# Patient Record
Sex: Female | Born: 1990 | Race: Black or African American | Hispanic: No | Marital: Single | Smoking: Current every day smoker
Health system: Southern US, Community
[De-identification: ages and names within clinical notes are randomized; demographics above are authoritative.]

---

## 2014-12-21 ENCOUNTER — Emergency Department (HOSPITAL_COMMUNITY)
Admission: EM | Admit: 2014-12-21 | Discharge: 2014-12-22 | Disposition: A | Discharge status: Home / Self Care | Payer: BLUE CROSS/BLUE SHIELD | Attending: Emergency Medicine | Admitting: Emergency Medicine

## 2014-12-21 ENCOUNTER — Encounter (HOSPITAL_COMMUNITY): Payer: Self-pay | Admitting: Emergency Medicine

## 2014-12-21 DIAGNOSIS — Z720 Tobacco use: Secondary | ICD-10-CM | POA: Insufficient documentation

## 2014-12-21 DIAGNOSIS — L509 Urticaria, unspecified: Secondary | ICD-10-CM | POA: Diagnosis not present

## 2014-12-21 DIAGNOSIS — R21 Rash and other nonspecific skin eruption: Secondary | ICD-10-CM | POA: Diagnosis present

## 2014-12-21 MED ORDER — DIPHENHYDRAMINE HCL 25 MG PO CAPS
50.0000 mg | ORAL_CAPSULE | Freq: Once | ORAL | Status: AC
  Administered 2014-12-21: 50 mg via ORAL
  Filled 2014-12-21: qty 2

## 2014-12-21 NOTE — ED Provider Notes (Signed)
CSN: [id number]     Arrival date & time 12/21/14  2207 History   First MD Initiated Contact with Patient 12/21/14 2232     Chief Complaint  Patient presents with  . Rash     (Consider location/radiation/quality/duration/timing/severity/associated sxs/prior Treatment) HPI Comments: Patient presents to the emergency department with chief complaint of hives on upper and lower extremities and torso today. Patient states symptoms started around 2 PM. She has tried using hydrocortisone cream as well as showering with no real relief. She denies any fevers, chills, nausea, vomiting, throat tightening sensation, or difficulty breathing. There are no aggravating factors. She has never had anything like this before. She denies any new contacts to allergens.  The history is provided by the patient. No language interpreter was used.    History reviewed. No pertinent past medical history. Past Surgical History  Procedure Laterality Date  . Cesarean section     No family history on file. Social History  Substance Use Topics  . Smoking status: Current Every Day Smoker  . Smokeless tobacco: None  . Alcohol Use: Yes   OB History    No data available     Review of Systems  Constitutional: Negative for fever and chills.  Respiratory: Negative for shortness of breath.   Cardiovascular: Negative for chest pain.  Gastrointestinal: Negative for nausea, vomiting, diarrhea and constipation.  Genitourinary: Negative for dysuria.  Skin: Positive for rash.      Allergies  Review of patient's allergies indicates no known allergies.  Home Medications   Prior to Admission medications   Not on File   BP 120/87 mmHg  Pulse 96  Temp(Src) 98.7 F (37.1 C) (Oral)  Resp 14  Ht  (1.626 m)  Wt 128 lb (58.06 kg)  BMI 21.96 kg/m2  SpO2 98%  LMP 12/07/2014 Physical Exam  Constitutional: She is oriented to person, place, and time. She appears well-developed and well-nourished.  HENT:  Head:  Normocephalic and atraumatic.  Oropharynx is clear, no stridor, no airway compromise  Eyes: Conjunctivae and EOM are normal. Pupils are equal, round, and reactive to light.  Neck: Normal range of motion. Neck supple.  Cardiovascular: Normal rate and regular rhythm.  Exam reveals no gallop and no friction rub.   No murmur heard. Pulmonary/Chest: Effort normal and breath sounds normal. No respiratory distress. She has no wheezes. She has no rales. She exhibits no tenderness.  Clear to auscultation bilaterally  Abdominal: Soft. Bowel sounds are normal. She exhibits no distension and no mass. There is no tenderness. There is no rebound and no guarding.  Musculoskeletal: Normal range of motion. She exhibits no edema or tenderness.  Neurological: She is alert and oriented to person, place, and time.  Skin: Skin is warm and dry.  Hives on extremities and torso  Psychiatric: She has a normal mood and affect. Her behavior is normal. Judgment and thought content normal.  Nursing note and vitals reviewed.   ED Course  Procedures (including critical care time)   MDM   Final diagnoses:  Hives    Patient with hives, will treat with Benadryl, no evidence of anaphylactic reaction. Anticipate discharge to home with Benadryl and OTC antihistamine.   12:24 AM Patient improved after benadryl.  Feels well.  DC to home.  Continue benadryl and add zyrtec.   Roxy Horseman, PA-C 12/22/14 4098  Devoria Albe, MD 12/22/14 [id number]

## 2014-12-21 NOTE — ED Notes (Signed)
Pt. reports itchy skin  rashes/hives at arms/legs and torso onset today , denies fever , airway intact / respirations unlabored .

## 2014-12-22 NOTE — ED Notes (Signed)
Pt left with all belongings and ambulated out of the treatment area.  

## 2014-12-22 NOTE — Discharge Instructions (Signed)
Please take benadryl and zyrtec for the next 3-4 days.  If your symptoms persist, please follow-up with your regular doctor or see one listed below.  Hives Hives are itchy, red, swollen areas of the skin. They can vary in size and location on your body. Hives can come and go for hours or several days (acute hives) or for several weeks (chronic hives). Hives do not spread from person to person (noncontagious). They may get worse with scratching, exercise, and emotional stress. CAUSES   Allergic reaction to food, additives, or drugs.  Infections, including the common cold.  Illness, such as vasculitis, lupus, or thyroid disease.  Exposure to sunlight, heat, or cold.  Exercise.  Stress.  Contact with chemicals. SYMPTOMS   Red or white swollen patches on the skin. The patches may change size, shape, and location quickly and repeatedly.  Itching.  Swelling of the hands, feet, and face. This may occur if hives develop deeper in the skin. DIAGNOSIS  Your caregiver can usually tell what is wrong by performing a physical exam. Skin or blood tests may also be done to determine the cause of your hives. In some cases, the cause cannot be determined. TREATMENT  Mild cases usually get better with medicines such as antihistamines. Severe cases may require an emergency epinephrine injection. If the cause of your hives is known, treatment includes avoiding that trigger.  HOME CARE INSTRUCTIONS   Avoid causes that trigger your hives.  Take antihistamines as directed by your caregiver to reduce the severity of your hives. Non-sedating or low-sedating antihistamines are usually recommended. Do not drive while taking an antihistamine.  Take any other medicines prescribed for itching as directed by your caregiver.  Wear loose-fitting clothing.  Keep all follow-up appointments as directed by your caregiver. SEEK MEDICAL CARE IF:   You have persistent or severe itching that is not relieved with  medicine.  You have painful or swollen joints. SEEK IMMEDIATE MEDICAL CARE IF:   You have a fever.  Your tongue or lips are swollen.  You have trouble breathing or swallowing.  You feel tightness in the throat or chest.  You have abdominal pain. These problems may be the first sign of a life-threatening allergic reaction. Call your local emergency services (911 in U.S.). MAKE SURE YOU:   Understand these instructions.  Will watch your condition.  Will get help right away if you are not doing well or get worse. Document Released: 04/12/2005 Document Revised: 04/17/2013 Document Reviewed: 07/06/2011 [facility] Patient Information 2015 [employer], [place]. This information is not intended to replace advice given to you by your health care provider. Make sure you discuss any questions you have with your health care provider.   Emergency Department Resource Guide 1) Find a Doctor and Pay Out of Pocket Although you won't have to find out who is covered by your insurance plan, it is a good idea to ask around and get recommendations. You will then need to call the office and see if the doctor you have chosen will accept you as a new patient and what types of options they offer for patients who are self-pay. Some doctors offer discounts or will set up payment plans for their patients who do not have insurance, but you will need to ask so you aren't surprised when you get to your appointment.  2) Contact Your Local Health Department Not all health departments have doctors that can see patients for sick visits, but many do, so it is worth a call  to see if yours does. If you don't know where your local health department is, you can check in your phone book. The CDC also has a tool to help you locate your state's health department, and many state websites also have listings of all of their local health departments.  3) Find a Walk-in Clinic If your illness is not likely to be very severe or  complicated, you may want to try a walk in clinic. These are popping up all over the country in pharmacies, drugstores, and shopping centers. They're usually staffed by nurse practitioners or physician assistants that have been trained to treat common illnesses and complaints. They're usually fairly quick and inexpensive. However, if you have serious medical issues or chronic medical problems, these are probably not your best option.  No Primary Care Doctor: - Call Health Connect at  [phone] - they can help you locate a primary care doctor that  accepts your insurance, provides certain services, etc. - Physician Referral Service- [phone]  Chronic Pain Problems: Organization         Address  Phone   Notes  [facility]  [phone] Patients need to be referred by their primary care doctor.   Medication Assistance: Organization         Address  Phone   Notes  [facility] Medication [facility] [address] [place] [place], [place] [address] [phone] --Must be a resident of [facility] -- Must have NO insurance coverage whatsoever (no Medicaid/ Medicare, etc.) -- The pt. MUST have a primary care doctor that directs their care regularly and follows them in the community   [facility]  [phone]   [employer]  [phone]    Agencies that provide inexpensive medical care: Organization         Address  Phone   Notes  [facility]  [phone]   [facility]    [phone]   [facility] [address] [place], [place] [address] [phone]   [facility] [address], [place] [phone]   Planned Parenthood    [phone]   [facility]    [phone]   [facility]  [address], [place] Phone:  [phone], Fax:  [phone] Hours of Operation:  9 am - 6 pm, M-F.  Also accepts  Medicaid/Medicare and self-pay.  [facility]  [address], [place] Phone: [phone], Fax: [phone]. Hours of Operation:  8:30 am - 5:30 pm, M-F.  Also accepts Medicaid and self-pay.  [facility] [place] [address], [place] Phone: [phone]   [facility] [address] Natasha Bence [place], [place] [phone], Ext. [phone] Mondays & Thursdays: 7-9 AM.  First 15 patients are seen on a first come, first serve basis.    Medicaid-accepting [facility] Providers:  Organization         Address  Phone   Notes  [facility] [address], [place] [phone] Also accepts self-pay patients.  [facility] [address] Laurell Josephs [place], [place]  [phone]   [facility] [address], [place] [phone]   [facility] [address], [place] [phone]   Renaye Rakers [address], [place]   (  [phone] Only accepts [place] Access Medicaid patients after they have their name applied to their card.   Self-Pay (no insurance) in [facility] [place]:  Organization         Address  Phone   Notes  Sickle Cell Patients, [facility] [address], [place] [phone]   [facility] [address], [place] [phone]   [facility] [place]  [address], [place] [phone]   [facility]/Dr. Osei-Bonsu  [address] [place] [address], [place] or [phone], [place] [phone] Phone number for both [place] and [place] locations is the same.  [facility] and [facility] [address], [place] [phone]   [facility] [address] [place] [address], [place] or [address] [phone]   [facility] [address], [place] [phone])  [phone], phone; [phone]) [phone], fax Sees patients 1st and 3rd Saturday of every month.  Must not qualify for public or private insurance (i.e. Medicaid, Medicare, [place] Health Choice, Veterans' Benefits)  Household income should be no more than 200% of the poverty level The clinic cannot treat you if you are pregnant or think you are pregnant  Sexually transmitted diseases are not treated at the clinic.    Dental Care: Organization         Address  Phone  Notes  [facility] of [facility] [address], [place] [phone] Accepts children up to age 26 who are enrolled in [place] or [place] Health Choice; pregnant women with a Medicaid card; and children who have applied for Medicaid or [place] Health Choice, but were declined, whose parents can pay a reduced fee at time of service.  [facility] of [facility] [place]  [address], [place] [phone] Accepts children up to age 26 who are enrolled in [place] or [place] Health Choice; pregnant women with a Medicaid card; and children who have applied for Medicaid or [place] Health Choice, but were declined, whose parents can pay a reduced fee at time of service.  [facility]  [address], [place] [phone] Patients are seen by appointment only. Walk-ins are not accepted. [facility] will see patients 26 years of age and older. Monday - Tuesday (8am-5pm) Most Wednesdays (8:30-5pm) $30 per visit, cash only  [facility]  [address], [facility] [phone] Patients are seen by appointment only. Walk-ins are not accepted. [facility] will see patients 26 years of age and older. One Wednesday Evening (Monthly: Volunteer Based).  $30 per visit, cash only  [employer] [facility]  [phone]) [phone] for adults; Children under age 394, call Graduate Pediatric Dentistry at [phone]) [phone]. Children aged  254-14, please call [phone]) [phone] to request a pediatric application.  Dental services are provided in all areas of dental care including fillings, crowns and bridges, complete and partial dentures, implants, gum treatment, root canals, and extractions. Preventive care is also provided. Treatment is provided to both adults and children. Patients are selected via a lottery and there is often a waiting list.   [facility] [address], [place]  [phone] [email or web address]   [facility] [address], [place], [place] [phone], Ext. [phone] Second and Fourth Thursday of each month, opens at 6:30  AM; Clinic ends at 9 AM.  Patients are seen on a first-come first-served basis, and a limited number are seen during each clinic.   [facility]  [address] Ether Griffins [place], [place] [phone]   Eligibility Requirements You must have lived in [place], [place], or [place] counties for at least the last three months.   You cannot be eligible for state or federal sponsored [place], including [employer], [place], or [employer].   You generally cannot be eligible for healthcare insurance through your employer.    How to apply: Eligibility screenings are held every Tuesday and Wednesday afternoon from 1:00 pm until 4:00 pm. You do not need an appointment for the interview!  [facility] [address] [place], [place] [phone]   [facility]  [phone]   [facility] Health Department  [phone]   [facility] Health Department  [phone]    Behavioral Health Resources in the Community: Intensive Outpatient Programs Organization         Address  Phone  Notes  [facility] [address], [place], [place] [phone]   [facility] Outpatient [address] [place], [place] [phone]   [facility] [address]  [place], [place]  [phone]   [facility] [address],  [place], [place] [phone] or [phone]   Substance Abuse Resources Organization         Address  Phone  Notes  [facility] Drug Services  [phone]   [facility]  [phone]   [facility] [place]  [phone]   Floydene Flock  [phone]   Residential & Outpatient Substance Abuse Program  [phone]   Psychological Services Organization         Address  Phone  Notes  [facility] Behavioral Health  [phone]   [facility]  [phone]   [facility] [address], [place] [phone] or [phone]    Mobile Crisis Teams Organization         Address  Phone  Notes  [facility], Mobile Crisis Care Unit  [phone]   [facility]  [address] [place], [place] [phone]   Doristine Locks [address] [place] [phone]    Self-Help/Support Groups Organization         Address  Phone             Notes  [facility] [place] - variety of support groups  336- I7437963 Call for more information  Narcotics Anonymous (NA), [facility] [address], [employer] [place]  2 meetings at this location   Statistician         Address  Phone  Notes  [facility] [address],    [place]  [phone]   [facility]  [address], [place] [address], [place], [place] [phone]   [facility] [address] [place], [place] [phone] Admissions: 8am-3pm M-F  [facility] [address]    [place], [place] [phone]   [facility] [address] Starling Manns [place], [place] [phone]   [facility] [address]  [place], [place] [phone]   [facility] - Intensive Outpatient [address] Laurell Josephs [address], [place], [place] [phone]   [facility]  ([facility]iction [facility] [address] [place], [place] [phone] or [phone]  [facility] ([facility]) [address] [place], [place] [phone] Accepts Medicaid  [facility] [address]  [place] [phone] Substance Abuse/Addiction Treatment   [facility] Organization         Address  Phone  Notes  [facility]  [phone]   Angie Fava, PhD [address] Ervin Knack [place], [place]   [phone] or ([phone])[phone]   [facility] Behavioral   [address] [place], [place] [phone]   [facility] [address] [place], [place] [phone] Insurance/Medicaid/sponsorship through [facility] and Families [address]                                    [place], [place] [phone] Therapy/tele-psych/case  [facility] [address], [place] [phone]    Dr. Lolly Mustache  [phone]   [facility]  [employer] [facility] Dept. 1) [address], [place] 2) [address] [place] 3)  [address], [place] [phone]  [phone]   [facility] Child Abuse Hotline 989-324-9257 or [phone] (After Hours)

## 2015-11-14 ENCOUNTER — Emergency Department (HOSPITAL_COMMUNITY)
Admission: EM | Admit: 2015-11-14 | Discharge: 2015-11-14 | Disposition: A | Discharge status: Home / Self Care | Payer: Self-pay | Attending: Emergency Medicine | Admitting: Emergency Medicine

## 2015-11-14 ENCOUNTER — Emergency Department (HOSPITAL_COMMUNITY): Payer: Self-pay

## 2015-11-14 ENCOUNTER — Encounter (HOSPITAL_COMMUNITY): Payer: Self-pay | Admitting: Physical Medicine and Rehabilitation

## 2015-11-14 DIAGNOSIS — S41111A Laceration without foreign body of right upper arm, initial encounter: Secondary | ICD-10-CM

## 2015-11-14 DIAGNOSIS — S51811A Laceration without foreign body of right forearm, initial encounter: Secondary | ICD-10-CM | POA: Insufficient documentation

## 2015-11-14 DIAGNOSIS — F17200 Nicotine dependence, unspecified, uncomplicated: Secondary | ICD-10-CM | POA: Insufficient documentation

## 2015-11-14 DIAGNOSIS — Y999 Unspecified external cause status: Secondary | ICD-10-CM | POA: Insufficient documentation

## 2015-11-14 DIAGNOSIS — S61216A Laceration without foreign body of right little finger without damage to nail, initial encounter: Secondary | ICD-10-CM | POA: Insufficient documentation

## 2015-11-14 DIAGNOSIS — Y929 Unspecified place or not applicable: Secondary | ICD-10-CM | POA: Insufficient documentation

## 2015-11-14 DIAGNOSIS — Y939 Activity, unspecified: Secondary | ICD-10-CM | POA: Insufficient documentation

## 2015-11-14 LAB — BASIC METABOLIC PANEL
ANION GAP: 7 (ref 5–15)
CO2: 23 mmol/L (ref 22–32)
Calcium: 8.9 mg/dL (ref 8.9–10.3)
Chloride: 111 mmol/L (ref 101–111)
Creatinine, Ser: 0.5 mg/dL (ref 0.44–1.00)
GFR calc Af Amer: >60 mL/min (ref >60)
GFR calc non Af Amer: >60 mL/min (ref >60)
GLUCOSE: 104 mg/dL — AB (ref 65–99)
POTASSIUM: 3.3 mmol/L — AB (ref 3.5–5.1)
Sodium: 141 mmol/L (ref 135–145)

## 2015-11-14 LAB — URINALYSIS, ROUTINE W REFLEX MICROSCOPIC
Bilirubin Urine: NEGATIVE
Glucose, UA: NEGATIVE mg/dL
Hgb urine dipstick: NEGATIVE
Ketones, ur: NEGATIVE mg/dL
LEUKOCYTES UA: NEGATIVE
NITRITE: NEGATIVE
PH: 6 (ref 5.0–8.0)
Protein, ur: NEGATIVE mg/dL
SPECIFIC GRAVITY, URINE: 1.012 (ref 1.005–1.030)

## 2015-11-14 LAB — RAPID URINE DRUG SCREEN, HOSP PERFORMED
Amphetamines: NOT DETECTED
BENZODIAZEPINES: NOT DETECTED
Barbiturates: NOT DETECTED
Cocaine: NOT DETECTED
OPIATES: NOT DETECTED
Tetrahydrocannabinol: POSITIVE — AB

## 2015-11-14 LAB — CBC
HEMATOCRIT: 40.6 % (ref 36.0–46.0)
Hemoglobin: 13.8 g/dL (ref 12.0–15.0)
MCH: 31.2 pg (ref 26.0–34.0)
MCHC: 34 g/dL (ref 30.0–36.0)
MCV: 91.6 fL (ref 78.0–100.0)
Platelets: 205 10*3/uL (ref 150–400)
RBC: 4.43 MIL/uL (ref 3.87–5.11)
RDW: 14.4 % (ref 11.5–15.5)
WBC: 5.2 10*3/uL (ref 4.0–10.5)

## 2015-11-14 LAB — ETHANOL: Alcohol, Ethyl (B): 248 mg/dL — ABNORMAL HIGH (ref <5)

## 2015-11-14 MED ORDER — SODIUM CHLORIDE 0.9 % IV BOLUS (SEPSIS)
1000.0000 mL | Freq: Once | INTRAVENOUS | Status: AC
  Administered 2015-11-14: 1000 mL via INTRAVENOUS

## 2015-11-14 MED ORDER — LIDOCAINE HCL (PF) 1 % IJ SOLN
10.0000 mL | Freq: Once | INTRAMUSCULAR | Status: AC
  Administered 2015-11-14: 10 mL
  Filled 2015-11-14: qty 10

## 2015-11-14 MED ORDER — TETANUS-DIPHTH-ACELL PERTUSSIS 5-2.5-18.5 LF-MCG/0.5 IM SUSP
0.5000 mL | Freq: Once | INTRAMUSCULAR | Status: AC
  Administered 2015-11-14: 0.5 mL via INTRAMUSCULAR
  Filled 2015-11-14: qty 0.5

## 2015-11-14 MED ORDER — CEPHALEXIN 500 MG PO CAPS: 500.0000 mg | ORAL_CAPSULE | Freq: Four times a day (QID) | ORAL | Status: AC

## 2015-11-14 MED ORDER — CEPHALEXIN 250 MG PO CAPS
500.0000 mg | ORAL_CAPSULE | Freq: Once | ORAL | Status: AC
  Administered 2015-11-14: 500 mg via ORAL
  Filled 2015-11-14: qty 2

## 2015-11-14 NOTE — ED Notes (Signed)
Social work at bedside.  
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
## 2015-11-14 NOTE — ED Notes (Signed)
PA at bedside performing suture care. Pt tolerating without difficulty. 

## 2015-11-14 NOTE — ED Notes (Signed)
Pt resting with eyes closed. Vital signs stable. No signs of distress noted.
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
## 2015-11-14 NOTE — ED Notes (Signed)
Spoke with Child psychotherapistsocial worker, to come back and talk with patient. PA aware.

## 2015-11-14 NOTE — ED Notes (Signed)
Social work called for consult.
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
## 2015-11-14 NOTE — Discharge Instructions (Signed)

## 2015-11-14 NOTE — ED Notes (Signed)
Laceration noted to R forearm and R 5th digit. Bleeding controlled. Tetanus unknown. Able to wiggle digits. Able to move all extremities.

## 2015-11-14 NOTE — ED Notes (Signed)
Pt resting with eyes closed. Vital signs stable at present.

## 2015-11-14 NOTE — ED Notes (Signed)
Pt up to bathroom without difficulty. To be discharged with cab voucher. Pt clinically sober, able to ambulate without issues.
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
## 2015-11-14 NOTE — ED Notes (Signed)
Finger splint applied to R 5th digit.

## 2015-11-14 NOTE — ED Notes (Signed)
Pt tolerated suture repair without difficulty. Wounds covered with dressing. Wound care discussed. Pt has no futher questions at the time.
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
## 2015-11-14 NOTE — ED Notes (Signed)
Pt transported to xray 
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
